# Patient Record
Sex: Male | Born: 1990 | Race: White | Hispanic: No | Marital: Single
Health system: Southern US, Community
[De-identification: ages and names within clinical notes are randomized; demographics above are authoritative.]

## PROBLEM LIST (undated history)

## (undated) DIAGNOSIS — R569 Unspecified convulsions: Secondary | ICD-10-CM

## (undated) DIAGNOSIS — F319 Bipolar disorder, unspecified: Secondary | ICD-10-CM

---

## 2018-10-25 ENCOUNTER — Other Ambulatory Visit: Payer: Self-pay

## 2018-10-25 ENCOUNTER — Encounter (HOSPITAL_COMMUNITY): Payer: Self-pay | Admitting: Emergency Medicine

## 2018-10-25 ENCOUNTER — Emergency Department (HOSPITAL_COMMUNITY)
Admission: EM | Admit: 2018-10-25 | Discharge: 2018-10-25 | Payer: Self-pay | Attending: Emergency Medicine | Admitting: Emergency Medicine

## 2018-10-25 DIAGNOSIS — Z5320 Procedure and treatment not carried out because of patient's decision for unspecified reasons: Secondary | ICD-10-CM | POA: Insufficient documentation

## 2018-10-25 DIAGNOSIS — T6591XA Toxic effect of unspecified substance, accidental (unintentional), initial encounter: Secondary | ICD-10-CM | POA: Insufficient documentation

## 2018-10-25 MED ORDER — SODIUM CHLORIDE 0.9 % IV BOLUS: 1000.0000 mL | Freq: Once | INTRAVENOUS | Status: DC

## 2018-10-25 MED ORDER — ONDANSETRON HCL 4 MG/2ML IJ SOLN: 4.0000 mg | Freq: Once | INTRAMUSCULAR | Status: DC

## 2018-10-25 NOTE — Discharge Instructions (Signed)
Return to the ED at any time you would like to be evaluated.  Please return for signs that you may have overdosed on opiates.  Please have someone keep an eye on you if you are able to get out of prison.

## 2018-10-25 NOTE — ED Notes (Signed)
Patient ambulatory to restroom with EMT.

## 2018-10-25 NOTE — ED Notes (Addendum)
Patient tearful c/o abdominal pain. States "I ate a bag of heroin." States ingested approximately 4.5g when [employer] arrived to house. MD made aware.

## 2018-10-25 NOTE — ED Provider Notes (Signed)
[facility] DEPT Provider Note   CSN: [id number] Arrival date & time: 10/25/18  1659    History   Chief Complaint Chief Complaint  Patient presents with  . Ingestion  . Alcohol Intoxication    HPI Paul Collins is a 28 y.o. male.     28 yo M with a chief complaint of heroin ingestion.  The patient was apparently apprehended by police.  He then was witnessed to ingest some heroin.  Told someone that he ingested somewhere over 4 g.  Has been having some abdominal cramping and retching in route and so was brought to the ED for evaluation.  Denies any other illegal substance.  Denies alcohol.  Patient gives very limited history.  The history is provided by the patient.  Ingestion This is a new problem. The current episode started less than 1 hour ago. The problem occurs constantly. The problem has not changed since onset.Associated symptoms include abdominal pain. Pertinent negatives include no chest pain, no headaches and no shortness of breath. Nothing aggravates the symptoms. Nothing relieves the symptoms. He has tried nothing for the symptoms. The treatment provided no relief.  Alcohol Intoxication Associated symptoms include abdominal pain. Pertinent negatives include no chest pain, no headaches and no shortness of breath.    History reviewed. No pertinent past medical history.  There are no active problems to display for this patient.   History reviewed. No pertinent surgical history.      Home Medications    Prior to Admission medications   Not on File    Family History No family history on file.  Social History Social History   Tobacco Use  . Smoking status: Not on file  Substance Use Topics  . Alcohol use: Not on file  . Drug use: Not on file     Allergies   Patient has no known allergies.   Review of Systems Review of Systems  Constitutional: Negative for chills and fever.  HENT: Negative for congestion and facial  swelling.   Eyes: Negative for discharge and visual disturbance.  Respiratory: Negative for shortness of breath.   Cardiovascular: Negative for chest pain and palpitations.  Gastrointestinal: Positive for abdominal pain, constipation, nausea and vomiting. Negative for diarrhea.  Musculoskeletal: Negative for arthralgias and myalgias.  Skin: Negative for color change and rash.  Neurological: Negative for tremors, syncope and headaches.  Psychiatric/Behavioral: Negative for confusion and dysphoric mood.     Physical Exam Updated Vital Signs BP (!) 174/97   Pulse (!) 125   Temp (!) 97.3 F (36.3 C) (Oral)   Resp (!) 22   SpO2 96%   Physical Exam Vitals signs and nursing note reviewed.  Constitutional:      Appearance: He is well-developed.  HENT:     Head: Normocephalic and atraumatic.  Eyes:     Pupils: Pupils are equal, round, and reactive to light.  Neck:     Musculoskeletal: Normal range of motion and neck supple.     Vascular: No JVD.  Cardiovascular:     Rate and Rhythm: Regular rhythm. Tachycardia present.     Heart sounds: No murmur. No friction rub. No gallop.   Pulmonary:     Effort: No respiratory distress.     Breath sounds: No wheezing.  Abdominal:     General: There is no distension.     Tenderness: There is no abdominal tenderness. There is no guarding or rebound.  Musculoskeletal: Normal range of motion.  Skin:  Coloration: Skin is not pale.     Findings: No rash.  Neurological:     Mental Status: He is alert and oriented to person, place, and time.  Psychiatric:        Behavior: Behavior normal.      ED Treatments / Results  Labs (all labs ordered are listed, but only abnormal results are displayed) Labs Reviewed  CBC WITH DIFFERENTIAL/PLATELET  COMPREHENSIVE METABOLIC PANEL  ETHANOL  RAPID URINE DRUG SCREEN, HOSP PERFORMED  URINALYSIS, ROUTINE W REFLEX MICROSCOPIC  ACETAMINOPHEN LEVEL  SALICYLATE LEVEL    EKG None  Radiology No  results found.  Procedures Procedures (including critical care time)  Medications Ordered in ED Medications  ondansetron (ZOFRAN) injection 4 mg (has no administration in time range)  sodium chloride 0.9 % bolus 1,000 mL (has no administration in time range)     Initial Impression / Assessment and Plan / ED Course  I have reviewed the triage vital signs and the nursing notes.  Pertinent labs & imaging results that were available during my care of the patient were reviewed by me and considered in my medical decision making (see chart for details).        28 yo M with a chief complaints of possible illegal drug ingestion.  The patient apparently tried to swallow a whole bunch of heroin when the police showed up.  He estimates that it was little bit over 4 g.  Patient is somewhat sleepy on my initial exam though is complaining of nausea and abdominal cramping which seems somewhat unlikely of opiate ingestion.  His heart rates in the 130s.  We will give a bolus of IV fluids.  We will obtain a plain film of the chest and the abdomen to evaluate for ingested substances.  Observe in the emergency department.  The patient is now trying to leave the emergency department.  He now appears to be awake and alert and without any discomfort.  Talking complete sentences without difficulty.  Appears of sound mind.  I discussed with him at length that if he did ingest that much heroin that something that could kill him.  I told him that he is going to prison for his outstanding warrants and that he may not be watched as closely as if he were to stay here and have us evaluate him.  Patient stated understanding and states that he would just like to go to prison setting up on pulled out that he get out of jail.  Offered for him to return at any time.  5:49 PM:  I have discussed the diagnosis/risks/treatment options with the patient and believe the pt to be eligible for discharge home to follow-up with PCP. We  also discussed returning to the ED immediately if new or worsening sx occur. We discussed the sx which are most concerning (e.g., any concern for opiate overdose) that necessitate immediate return. Medications administered to the patient during their visit and any new prescriptions provided to the patient are listed below.  Medications given during this visit Medications  ondansetron (ZOFRAN) injection 4 mg (has no administration in time range)  sodium chloride 0.9 % bolus 1,000 mL (has no administration in time range)     The patient appears reasonably screen and/or stabilized for discharge and I doubt any other medical condition or other [facility] requiring further screening, evaluation, or treatment in the ED at this time prior to discharge.    Final Clinical Impressions(s) / ED Diagnoses   Final diagnoses:  Accidental ingestion of substance, initial encounter    ED Discharge Orders    None       Melene PlanFloyd, Haivyn Oravec, DO 10/25/18 1749

## 2018-10-25 NOTE — ED Notes (Signed)
Unable to get ekg patient is in the restroom

## 2018-10-25 NOTE — ED Notes (Addendum)
Patient stating he wants to leave "to get a bond." Patient refusing care. [employer] remains at bedside with patient. MD at bedside. Patient signed AMA. Made aware of risks of leaving. IV removed from left FA.

## 2018-10-25 NOTE — ED Triage Notes (Addendum)
Per EMS, patient was in custody on the way to jail when he started experiencing dry heaving. Patient spitting with EMS. Reports ETOH.  16g L FA  Patient not answering questions during triage.  [employer] reports patient stated he ingested approximately 0.5g of heroin.

## 2018-10-25 NOTE — ED Notes (Signed)
Patient removing cardiac monitoring. MD made aware.

## 2018-10-25 NOTE — ED Notes (Signed)
Per Patty at [employer],  Recommendations include:  Charcoal, monitor x6 hours for CNS and respiratory depression, Narcan if symptomatic

## 2018-10-26 ENCOUNTER — Emergency Department (HOSPITAL_COMMUNITY)

## 2018-10-26 ENCOUNTER — Encounter (HOSPITAL_COMMUNITY): Payer: Self-pay | Admitting: Emergency Medicine

## 2018-10-26 ENCOUNTER — Emergency Department (HOSPITAL_COMMUNITY)
Admission: EM | Admit: 2018-10-26 | Discharge: 2018-10-26 | Attending: Emergency Medicine | Admitting: Emergency Medicine

## 2018-10-26 DIAGNOSIS — S098XXA Other specified injuries of head, initial encounter: Secondary | ICD-10-CM | POA: Diagnosis not present

## 2018-10-26 DIAGNOSIS — S0990XA Unspecified injury of head, initial encounter: Secondary | ICD-10-CM

## 2018-10-26 DIAGNOSIS — Y92148 Other place in prison as the place of occurrence of the external cause: Secondary | ICD-10-CM | POA: Diagnosis not present

## 2018-10-26 DIAGNOSIS — W01198A Fall on same level from slipping, tripping and stumbling with subsequent striking against other object, initial encounter: Secondary | ICD-10-CM | POA: Insufficient documentation

## 2018-10-26 DIAGNOSIS — M542 Cervicalgia: Secondary | ICD-10-CM | POA: Insufficient documentation

## 2018-10-26 DIAGNOSIS — Y9302 Activity, running: Secondary | ICD-10-CM | POA: Diagnosis not present

## 2018-10-26 DIAGNOSIS — Y999 Unspecified external cause status: Secondary | ICD-10-CM | POA: Insufficient documentation

## 2018-10-26 HISTORY — DX: Bipolar disorder, unspecified: F31.9

## 2018-10-26 HISTORY — DX: Unspecified convulsions: R56.9

## 2018-10-26 MED ORDER — CYCLOBENZAPRINE HCL 10 MG PO TABS
10.0000 mg | ORAL_TABLET | Freq: Once | ORAL | Status: AC
  Administered 2018-10-26: 10 mg via ORAL
  Filled 2018-10-26: qty 1

## 2018-10-26 MED ORDER — IBUPROFEN 400 MG PO TABS: 600.0000 mg | ORAL_TABLET | Freq: Once | ORAL | Status: DC

## 2018-10-26 MED ORDER — KETOROLAC TROMETHAMINE 30 MG/ML IJ SOLN
30.0000 mg | Freq: Once | INTRAMUSCULAR | Status: AC
  Administered 2018-10-26: 30 mg via INTRAMUSCULAR
  Filled 2018-10-26: qty 1

## 2018-10-26 MED ORDER — FENTANYL CITRATE (PF) 100 MCG/2ML IJ SOLN
50.0000 ug | Freq: Once | INTRAMUSCULAR | Status: AC
  Administered 2018-10-26: 50 ug via INTRAMUSCULAR
  Filled 2018-10-26: qty 2

## 2018-10-26 NOTE — ED Triage Notes (Addendum)
Pt in police custody. Pt hit his head on a door at the jail after he took off running in jail. Pt has neck and upper back pain. Ambulatory.

## 2018-10-26 NOTE — Discharge Instructions (Signed)
You can take 600 mg of ibuprofen every 6 hours as needed for pain.  Take this with food to avoid upset stomach issues.  You can take 1 to 2 tablets of Tylenol every 6 hours additionally if your pain persist.  You can alternate these medications every 3 hours or so.  You may have a concussion, which is head injury without evidence of abnormality on CT scan.  Stay in a dimly lit room with minimal stimuli, reduce screen time (phone, TV) by at least 50% if not more, and avoid contact sports until cleared by a physician.    I anticipate you will probably feel sore for approximately 1 week.  Do not feel surprised if you wake up tomorrow feeling more sore than you do today. Follow-up with a primary care physician if you are not back to your normal activity levels within 1 week.  Return to the emergency department immediately for any concerning signs or symptoms develop such as altered mental status, persistent vomiting, weakness, severe swelling or redness of a limb, or fevers.

## 2018-10-26 NOTE — ED Provider Notes (Signed)
[facility]   CSN: [id number] Arrival date & time: 10/26/18  1244    History   Chief Complaint Chief Complaint  Patient presents with   Head Injury    HPI Dat Paul Collins is a 28 y.o. male with history of bipolar 1 disorder and seizures presents for evaluation of acute onset, persistent frontal headache and neck pain secondary to head injury 1 hour prior to arrival.  He is accompanied by police, currently in police custody.  Triage note states that the patient took off running while in the jail and the patient states that he tripped, landing forward and striking his forehead on a door.  He reports loss of consciousness for a few seconds.  Endorses persistent throbbing headache to the temples that does not radiate.  Initially reported some blurred vision but states this has resolved.  Denies nausea, vomiting, numbness, tingling, or weakness.  He is reporting sharp pains to the neck and upper back.  Denies chest pain, shortness of breath, abdominal pain.     The history is provided by the patient.    Past Medical History:  Diagnosis Date   Bipolar 1 disorder (HCC)    Seizures (HCC)     There are no active problems to display for this patient.   No past surgical history on file.      Home Medications    Prior to Admission medications   Not on File    Family History No family history on file.  Social History Social History   Tobacco Use   Smoking status: Not on file  Substance Use Topics   Alcohol use: Not on file   Drug use: Not on file     Allergies   Patient has no known allergies.   Review of Systems Review of Systems  Constitutional: Negative for fever.  Eyes: Positive for visual disturbance (resolved). Negative for photophobia.  Respiratory: Negative for shortness of breath.   Cardiovascular: Negative for chest pain.  Gastrointestinal: Negative for abdominal pain, nausea and vomiting.    Musculoskeletal: Positive for neck pain.  Neurological: Positive for syncope and headaches. Negative for weakness and numbness.  All other systems reviewed and are negative.    Physical Exam Updated Vital Signs BP (!) 150/98    Pulse 77    Temp (!) 97.3 F (36.3 C)    Resp 18    SpO2 95%   Physical Exam Vitals signs and nursing note reviewed.  Constitutional:      General: He is not in acute distress.    Appearance: He is well-developed.     Comments: Patient handcuffed to the bed, accompanied by law enforcement  HENT:     Head: Normocephalic and atraumatic.  Eyes:     General:        Right eye: No discharge.        Left eye: No discharge.     Conjunctiva/sclera: Conjunctivae normal.  Neck:     Musculoskeletal: Neck supple.     Vascular: No JVD.     Trachea: No tracheal deviation.     Comments: C-collar in place.  Patient with diffuse midline cervical spine tenderness with bilateral paracervical muscle tenderness.  No deformity, crepitus, or step-off. Cardiovascular:     Rate and Rhythm: Normal rate and regular rhythm.  Pulmonary:     Effort: Pulmonary effort is normal.     Breath sounds: Normal breath sounds.     Comments: No tenderness to palpation,  no crepitus, deformity, ecchymosis, or flail segment. Chest:     Chest wall: No tenderness.  Abdominal:     General: Bowel sounds are normal. There is no distension.     Palpations: Abdomen is soft.     Tenderness: There is no abdominal tenderness. There is no guarding or rebound.  Musculoskeletal:     Comments: 5/5 strength of BUE and BLE major muscle groups.  Normal range of motion of the joints.  No midline thoracic or lumbar spine tenderness or parathoracic or paralumbar muscle tenderness.  Skin:    General: Skin is warm and dry.     Findings: No erythema.  Neurological:     General: No focal deficit present.     Mental Status: He is alert and oriented to person, place, and time.     Cranial Nerves: No cranial  nerve deficit.     Sensory: No sensory deficit.     Motor: No weakness.     Comments: Mental Status:  Alert, thought content appropriate, able to give a coherent history. Speech fluent without evidence of aphasia. Able to follow 2 step commands without difficulty.  Cranial Nerves:  II:  Peripheral visual fields grossly normal, pupils equal, round, reactive to light III,IV, VI: ptosis not present, extra-ocular motions intact bilaterally  V,VII: smile symmetric, facial light touch sensation equal VIII: hearing grossly normal to voice  X: uvula elevates symmetrically  XI: bilateral shoulder shrug symmetric and strong XII: midline tongue extension without fassiculations Motor:  Normal tone. 5/5 strength of BUE and BLE major muscle groups including strong and equal grip strength and dorsiflexion/plantar flexion Sensory: light touch normal in all extremities. Gait: Ambulatory with steady gait and balance.  Psychiatric:        Behavior: Behavior normal.      ED Treatments / Results  Labs (all labs ordered are listed, but only abnormal results are displayed) Labs Reviewed - No data to display  EKG None  Radiology Ct Head Wo Contrast  Result Date: 10/26/2018 CLINICAL DATA:  Posttraumatic headaches. EXAM: CT HEAD WITHOUT CONTRAST CT CERVICAL SPINE WITHOUT CONTRAST TECHNIQUE: Multidetector CT imaging of the head and cervical spine was performed following the standard protocol without intravenous contrast. Multiplanar CT image reconstructions of the cervical spine were also generated. COMPARISON:  None. FINDINGS: CT HEAD FINDINGS Brain: No evidence of acute infarction, hemorrhage, hydrocephalus, extra-axial collection or mass lesion/mass effect. Vascular: No hyperdense vessel or unexpected calcification. Skull: Normal. Negative for fracture or focal lesion. Sinuses/Orbits: No acute finding. Other: None. CT CERVICAL SPINE FINDINGS Alignment: Normal. Skull base and vertebrae: No acute fracture.  No primary bone lesion or focal pathologic process. Soft tissues and spinal canal: No prevertebral fluid or swelling. No visible canal hematoma. Disc levels: The intervertebral spaces are normal. No significant degenerative joint changes are identified. Upper chest: Negative. Other: None IMPRESSION: No focal acute intracranial abnormality identified. No acute fracture or dislocation of cervical spine. Electronically Signed   By: Sherian ReinWei-Chen  Lin M.D.   On: 10/26/2018 14:02   Ct Cervical Spine Wo Contrast  Result Date: 10/26/2018 CLINICAL DATA:  Posttraumatic headaches. EXAM: CT HEAD WITHOUT CONTRAST CT CERVICAL SPINE WITHOUT CONTRAST TECHNIQUE: Multidetector CT imaging of the head and cervical spine was performed following the standard protocol without intravenous contrast. Multiplanar CT image reconstructions of the cervical spine were also generated. COMPARISON:  None. FINDINGS: CT HEAD FINDINGS Brain: No evidence of acute infarction, hemorrhage, hydrocephalus, extra-axial collection or mass lesion/mass effect. Vascular: No hyperdense vessel or unexpected  calcification. Skull: Normal. Negative for fracture or focal lesion. Sinuses/Orbits: No acute finding. Other: None. CT CERVICAL SPINE FINDINGS Alignment: Normal. Skull base and vertebrae: No acute fracture. No primary bone lesion or focal pathologic process. Soft tissues and spinal canal: No prevertebral fluid or swelling. No visible canal hematoma. Disc levels: The intervertebral spaces are normal. No significant degenerative joint changes are identified. Upper chest: Negative. Other: None IMPRESSION: No focal acute intracranial abnormality identified. No acute fracture or dislocation of cervical spine. Electronically Signed   By: Abelardo Diesel M.D.   On: 10/26/2018 14:02    Procedures Procedures (including critical care time)  Medications Ordered in ED Medications  cyclobenzaprine (FLEXERIL) tablet 10 mg (10 mg Oral Given 10/26/18 1336)  fentaNYL  (SUBLIMAZE) injection 50 mcg (50 mcg Intramuscular Given 10/26/18 1335)  ketorolac (TORADOL) 30 MG/ML injection 30 mg (30 mg Intramuscular Given 10/26/18 1426)     Initial Impression / Assessment and Plan / ED Course  I have reviewed the triage vital signs and the nursing notes.  Pertinent labs & imaging results that were available during my care of the patient were reviewed by me and considered in my medical decision making (see chart for details).        Patient presents for evaluation after mechanical fall.  No prodrome leading up to the fall.  He was reportedly running in the jail.  He has a headache and neck pain.  He is afebrile, vital signs are stable.  He is nontoxic in appearance.  He is neurovascularly intact with a normal neurologic examination.  Has been ambulatory since hitting his head and is ambulatory in the ED.  Imaging shows no evidence of acute intracranial abnormality, skull fracture, ICH, SAH, or cervical spine injury.  The remainder of his physical examination is atraumatic and he shows no signs of serious head injury.  Pain controlled in the ED.  Discussed concussion precautions.  Recommend follow-up with PCP if symptoms persist.  Discussed strict ED return precautions.  Patient verbalized understanding of and agreement with plan and patient stable for discharge at this time.  Final Clinical Impressions(s) / ED Diagnoses   Final diagnoses:  Injury of head, initial encounter  Neck pain, acute    ED Discharge Orders    None       Renita Papa, PA-C 10/26/18 [place], [place], DO 10/26/18 1556

## 2018-10-26 NOTE — ED Notes (Signed)
To ct with deputies

## 2018-10-27 ENCOUNTER — Emergency Department (HOSPITAL_COMMUNITY): Payer: Self-pay

## 2018-10-27 ENCOUNTER — Encounter (HOSPITAL_COMMUNITY): Payer: Self-pay

## 2018-10-27 ENCOUNTER — Emergency Department (HOSPITAL_COMMUNITY)
Admission: EM | Admit: 2018-10-27 | Discharge: 2018-10-27 | Payer: Self-pay | Attending: Emergency Medicine | Admitting: Emergency Medicine

## 2018-10-27 ENCOUNTER — Other Ambulatory Visit: Payer: Self-pay

## 2018-10-27 DIAGNOSIS — Y939 Activity, unspecified: Secondary | ICD-10-CM | POA: Insufficient documentation

## 2018-10-27 DIAGNOSIS — Y92143 Cell of prison as the place of occurrence of the external cause: Secondary | ICD-10-CM | POA: Insufficient documentation

## 2018-10-27 DIAGNOSIS — S0990XA Unspecified injury of head, initial encounter: Secondary | ICD-10-CM

## 2018-10-27 DIAGNOSIS — F319 Bipolar disorder, unspecified: Secondary | ICD-10-CM | POA: Insufficient documentation

## 2018-10-27 DIAGNOSIS — S0001XA Abrasion of scalp, initial encounter: Secondary | ICD-10-CM | POA: Insufficient documentation

## 2018-10-27 DIAGNOSIS — R440 Auditory hallucinations: Secondary | ICD-10-CM | POA: Insufficient documentation

## 2018-10-27 DIAGNOSIS — X838XXA Intentional self-harm by other specified means, initial encounter: Secondary | ICD-10-CM | POA: Insufficient documentation

## 2018-10-27 DIAGNOSIS — Z7689 Persons encountering health services in other specified circumstances: Secondary | ICD-10-CM

## 2018-10-27 DIAGNOSIS — Y999 Unspecified external cause status: Secondary | ICD-10-CM | POA: Insufficient documentation

## 2018-10-27 DIAGNOSIS — Z23 Encounter for immunization: Secondary | ICD-10-CM | POA: Insufficient documentation

## 2018-10-27 DIAGNOSIS — S060X1A Concussion with loss of consciousness of 30 minutes or less, initial encounter: Secondary | ICD-10-CM | POA: Insufficient documentation

## 2018-10-27 MED ORDER — ACETAMINOPHEN 500 MG PO TABS
1000.0000 mg | ORAL_TABLET | Freq: Once | ORAL | Status: AC
  Administered 2018-10-27: 14:00:00 1000 mg via ORAL
  Filled 2018-10-27: qty 2

## 2018-10-27 MED ORDER — TETANUS-DIPHTH-ACELL PERTUSSIS 5-2.5-18.5 LF-MCG/0.5 IM SUSP
0.5000 mL | Freq: Once | INTRAMUSCULAR | Status: AC
  Administered 2018-10-27: 16:00:00 0.5 mL via INTRAMUSCULAR
  Filled 2018-10-27: qty 0.5

## 2018-10-27 MED ORDER — HYDROXYZINE HCL 25 MG PO TABS
25.0000 mg | ORAL_TABLET | Freq: Once | ORAL | Status: AC
  Administered 2018-10-27: 14:00:00 25 mg via ORAL
  Filled 2018-10-27: qty 1

## 2018-10-27 MED ORDER — ACETAMINOPHEN 325 MG PO TABS: 650.0000 mg | ORAL_TABLET | Freq: Four times a day (QID) | ORAL | 0 refills | Status: AC | PRN

## 2018-10-27 NOTE — ED Triage Notes (Addendum)
Patient from jail with guards. Patient reportedly hit head on wall. Staff report positive loc. Patient arrived with abrasion to forehead and c-collar. Patient did same yesterday. Patient alert and oriented. [employer] 15. Patient has been in jail only 2 days and been off [facility] meds x 2 weeks

## 2018-10-27 NOTE — ED Notes (Signed)
Deputies at the bedside.

## 2018-10-27 NOTE — ED Notes (Signed)
TTS in progress 

## 2018-10-27 NOTE — [facility] Counselor (Addendum)
Cart located and is not working, IT is present working on the cart.  Unable to start teleassessment, cart cannot be located.

## 2018-10-27 NOTE — BH Assessment (Signed)
Assessment Note  Paul Collins is an 28 y.o. male who was brought to [facility] by [employer] from detention.  The Patient bang his head on the wall after being told by voices to do it.  This is the third time since the Patient was arrested on 10-25-2018 that he had to be transported to ED from from jail. Patient presented orientated x4, mood "manic and panic", affect anxious.  Patient reports current SI with no intent or plan.  He denied HI and VH.  Patient reports AH of voices telling him to bang his head and try to get Deputies' guns.  Patient reports leaving AMA from [facility] inpatient psychiatric unit in [place] 3 weeks ago after finding out his Mother was dying of cancer in [place].  He reports attempting SI 7x in the past 6 months.  Patient reports being prescribed Depakote and Thorazine for Bipolar Disorder and Schizophrenia.  He reports not being medication adherent since leaving psychiatric hospitalization.  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  Patient reports being arrested for domestic violence and probation violation.  He reports wanting readmission to psychiatric hospital.  Per Ricky Ala, NP;  Patient does not meet inpatient criteria and will need to return to detention after medical clearance.  Patient's disposition provided to Langston Masker, PA-C            Diagnosis:  Bipolar 1 Disorder  Past Medical History:  Past Medical History:  Diagnosis Date  . Bipolar 1 disorder ([place])   . Seizures ([place])     History reviewed. No pertinent surgical history.  Family History: No family history on file.  Social History:  has no history on file for tobacco, alcohol, and drug.  Additional Social History:     CIWA: CIWA-Ar BP: 135/90 Pulse Rate: 76 COWS:    Allergies: No Known Allergies  Home Medications: (Not in a hospital admission)   OB/GYN Status:  No LMP for male patient.  General Assessment Data Location of Assessment: [facility] ED Is this a Tele or  Face-to-Face Assessment?: Tele Assessment Is this an Initial Assessment or a Re-assessment for this encounter?: Initial Assessment Patient Accompanied by:: Other(Law Enforcement) Language Other than English: No Living Arrangements: Jail/Detention Marital status: Single Living Arrangements: Other (Comment)(Jail) Can pt return to current living arrangement?: Yes Is patient capable of signing voluntary admission?: No Insurance type: Generic Inmates  Medical Screening Exam ([address]) Medical Exam completed: Yes  Crisis Care Plan Living Arrangements: Other (Comment)(Jail) Name of Psychiatrist: None Name of Therapist: None  Education Status Is patient currently in school?: No  Risk to self with the past 6 months Suicidal Ideation: Yes-Currently Present Has patient been a risk to self within the past 6 months prior to admission? : No Suicidal Intent: No Has patient had any suicidal intent within the past 6 months prior to admission? : No Is patient at risk for suicide?: No Suicidal Plan?: No Has patient had any suicidal plan within the past 6 months prior to admission? : No Access to Means: No What has been your use of drugs/alcohol within the last 12 months?: Heroin Previous Attempts/Gestures: Yes How many times?: 7(Patient reports in a 6 month period) Triggers for Past Attempts: Unknown Intentional Self Injurious Behavior: None Family Suicide History: Unknown Recent stressful life event(s): (incarcerated) Persecutory voices/beliefs?: Yes Depression: No Substance abuse history and/or treatment for substance abuse?: Yes(Heroin) Suicide prevention information given to non-admitted patients: Not applicable  Risk to Others within the  past 6 months Homicidal Ideation: No Does patient have any lifetime risk of violence toward others beyond the six months prior to admission? : No Thoughts of Harm to Others: No Current Homicidal Intent: No Current Homicidal Plan: No Access  to Homicidal Means: No History of harm to others?: No Assessment of Violence: None Noted Does patient have access to weapons?: No Criminal Charges Pending?: Yes Describe Pending Criminal Charges: Domestic violence aND PROBATION VIOLATION Does patient have a court date: Yes Court Date: ([place] awaiting a court date) Is patient on probation?: No  Psychosis Hallucinations: Auditory Delusions: None noted  Mental Status Report Appearance/Hygiene: In scrubs Eye Contact: Good Motor Activity: Unremarkable Speech: Logical/coherent Level of Consciousness: Alert Mood: Anxious Affect: Anxious Anxiety Level: Moderate Thought Processes: Coherent, Relevant Judgement: Partial Orientation: Person, Place, Time, Situation Obsessive Compulsive Thoughts/Behaviors: None  Cognitive Functioning Concentration: Good Memory: Recent Intact, Remote Intact Is patient IDD: No Insight: Poor Impulse Control: Poor Appetite: Good Have you had any weight changes? : No Change Sleep: No Change Vegetative Symptoms: None  ADLScreening [facility] Assessment Services) Patient's cognitive ability adequate to safely complete daily activities?: Yes Patient able to express need for assistance with ADLs?: Yes Independently performs ADLs?: Yes (appropriate for developmental age)  Prior Inpatient Therapy Prior Inpatient Therapy: Yes Prior Therapy Dates: 2020 Prior Therapy Facilty/Provider(s): [place]) Reason for Treatment: SI attempt  Prior Outpatient Therapy Prior Outpatient Therapy: Yes Prior Therapy Dates: Unknown Prior Therapy Facilty/Provider(s): Unknown Reason for Treatment: [place] Does patient have an ACCT team?: No Does patient have Intensive In-House Services?  : No Does patient have [facility] services? : No Does patient have P4CC services?: No  ADL Screening (condition at time of admission) Patient's cognitive ability adequate to safely complete daily activities?: Yes Is the patient deaf  or have difficulty hearing?: No Does the patient have difficulty seeing, even when wearing glasses/contacts?: No Does the patient have difficulty concentrating, remembering, or making decisions?: No Patient able to express need for assistance with ADLs?: Yes Does the patient have difficulty dressing or bathing?: No Independently performs ADLs?: Yes (appropriate for developmental age) Does the patient have difficulty walking or climbing stairs?: No Weakness of Legs: None Weakness of Arms/Hands: None  Home Assistive Devices/Equipment Home Assistive Devices/Equipment: None      Values / Beliefs Cultural Requests During Hospitalization: None Spiritual Requests During Hospitalization: None   Advance Directives (For Healthcare) Does Patient Have a Medical Advance Directive?: No Would patient like information on creating a medical advance directive?: No - Patient declined          Disposition:  Disposition Initial Assessment Completed for this Encounter: Yes  On Site Evaluation by:   Reviewed with Physician:    Dey-Johnson,Sayre Witherington 10/27/2018 4:18 PM

## 2018-10-27 NOTE — Discharge Instructions (Signed)
For headaches, would recommend Tylenol, 650 mg every 6 hours as needed for pain.   Consider jail psychiatrist to restart patient's reported antipsychotics if they can be verified from [place].   Would recommend continued assessment in jail for suicidality and self harm attempts.   Please return if there are any visual changes, weakness or numbness in extremities, confusion, or any new or worsening symptoms.  Thank you for allowing [place] to participate in your care today.

## 2018-10-27 NOTE — ED Provider Notes (Signed)
[facility] EM[facility]GENC[facility] Provider Note   CSN: [id number] Arrival date & time: 10/27/18  1147     History   Chief Complaint No chief complaint on file.   HPI Paul Collins is a 28 y.o. male.     HPI  Patient is a [age] male with past medical history of bipolar disorder, seizures presenting for head injury after intentionally hitting his head on a wall today. Patient reports that this was a self harm attempt. He reports "voices" caused him to do this.  He reports loss of consciousness and officers with him verify that there was a witnessed LOC.  No seizure-like activity.  Unclear how long the LOC was.  Patient denies vomiting.  He reports retrograde amnesia.  He denies visual disturbance, numbness of extremities, but feels that there is "shooting pain" rating on his right side that is making him feel weak.  He reports pain down his entire spine.  Patient reports that he came here 2 weeks ago [place] medication from [place].  He did not have a specific plan when he started driving here.  He does not know what pharmacy he had [place] prescriptions filled out in [place] to verify what medications he is supposed to be on.  Past Medical History:  Diagnosis Date   Bipolar 1 disorder ([facility])    Seizures ([facility])     There are no active problems to display for this patient.   History reviewed. No pertinent surgical history.      Home Medications    Prior to Admission medications   Not on File    Family History No family history on file.  Social History Social History   Tobacco Use   Smoking status: Not on file  Substance Use Topics   Alcohol use: Not on file   Drug use: Not on file     Allergies   Patient has no known allergies.   Review of Systems Review of Systems  Eyes: Negative for visual disturbance.  Gastrointestinal: Negative for vomiting.  Skin: Positive for wound.  Neurological: Positive for syncope and headaches. Negative for  dizziness, weakness, light-headedness and numbness.  All other systems reviewed and are negative.    Physical Exam Updated Vital Signs BP 135/90 (BP Location: Right Arm)    Pulse 76    Temp 98.5 F (36.9 C) (Oral)    Resp 18    SpO2 99%   Physical Exam Vitals signs and nursing note reviewed.  Constitutional:      General: He is not in acute distress.    Appearance: He is well-developed.  HENT:     Head: Normocephalic.     Comments: Small superficial abrasion over crown.     Nose: Nose normal.     Mouth/Throat:     Mouth: Mucous membranes are moist.  Eyes:     Conjunctiva/sclera: Conjunctivae normal.     Pupils: Pupils are equal, round, and reactive to light.  Neck:     Musculoskeletal: Normal range of motion and neck supple.  Cardiovascular:     Rate and Rhythm: Normal rate and regular rhythm.     Heart sounds: S1 normal and S2 normal. No murmur.  Pulmonary:     Effort: Pulmonary effort is normal.     Breath sounds: Normal breath sounds. No wheezing or rales.  Abdominal:     General: There is no distension.     Palpations: Abdomen is soft.     Tenderness: There is no  abdominal tenderness. There is no guarding.  Musculoskeletal: Normal range of motion.        General: No deformity.     Comments: Patient is reporting midline tenderness through the cervical collar of his cervical spine.  Paraspinal muscular tenderness of thoracic and lumbar spine throughout.  No midline tenderness of thoracic and lumbar spine.   Lymphadenopathy:     Cervical: No cervical adenopathy.  Skin:    General: Skin is warm and dry.     Findings: No erythema or rash.     Comments: Several tattoos present on skin. No lesions of skin.   Neurological:     Mental Status: He is alert.     Comments: Mental Status:  Alert, oriented, thought content appropriate, able to give a coherent history. Speech fluent without evidence of aphasia. Able to follow 2 step commands without difficulty.  Cranial Nerves:    II:  Peripheral visual fields grossly normal, pupils equal, round, reactive to light III,IV, VI: ptosis not present, extra-ocular motions intact bilaterally  V,VII: smile symmetric, facial light touch sensation equal VIII: hearing grossly normal to voice  X: uvula elevates symmetrically  XI: bilateral shoulder shrug symmetric and strong XII: midline tongue extension without fassiculations Motor:  Normal tone. 5/5 in upper and lower extremities bilaterally including strong and equal grip strength and dorsiflexion/plantar flexion Sensory: Pinprick and light touch normal in all extremities.  Deep Tendon Reflexes: 2+ and symmetric in the biceps and patella.  Cerebellar: normal finger-to-nose with bilateral upper extremities Gait: normal gait and balance Stance: No pronator drift and good coordination, strength, and position sense with tapping of bilateral arms (performed in sitting position). CV: distal pulses palpable throughout    Psychiatric:        Behavior: Behavior normal.        Thought Content: Thought content normal.        Judgment: Judgment normal.      ED Treatments / Results  Labs (all labs ordered are listed, but only abnormal results are displayed) Labs Reviewed - No data to display  EKG None  Radiology Dg Thoracic Spine 2 View  Result Date: 10/27/2018 CLINICAL DATA:  Center thoracic spine pain at the level of T7 after headbutting a wall this AM. Pt denies lumbar pain at time of imaging. He states he knocked himself out and fell and does not remember anything after. EXAM: THORACIC SPINE 2 VIEWS COMPARISON:  None. FINDINGS: No fracture or bone lesion.  No spondylolisthesis. Disc spaces are well preserved.  No degenerative changes. Soft tissues are unremarkable. IMPRESSION: Negative. Electronically Signed   By: Amie Portlandavid  Ormond M.D.   On: 10/27/2018 13:29   Dg Lumbar Spine Complete  Result Date: 10/27/2018 CLINICAL DATA:  Center thoracic spine pain at the level of T7  after headbutting a wall this AM. Pt denies lumbar pain at time of imaging. He states he knocked himself out and fell and does not remember anything after. EXAM: LUMBAR SPINE - COMPLETE 4+ VIEW COMPARISON:  None. FINDINGS: There is no evidence of lumbar spine fracture. Alignment is normal. Intervertebral disc spaces are maintained. IMPRESSION: Negative. Electronically Signed   By: Amie Portlandavid  Ormond M.D.   On: 10/27/2018 13:30   Ct Head Wo Contrast  Result Date: 10/27/2018 CLINICAL DATA:  Ct head/cspine wo, Patient from jail with guards. Patient reportedly hit head on wall. Staff report positive loc. Patient arrived with abrasion to forehead and c-collar. Patient did same yesterday. Patient alert and oriented. GCS 15. Patient has  been in jail only 2 days and been off [place] meds x 2 weeks EXAM: CT HEAD WITHOUT CONTRAST CT CERVICAL SPINE WITHOUT CONTRAST TECHNIQUE: Multidetector CT imaging of the head and cervical spine was performed following the standard protocol without intravenous contrast. Multiplanar CT image reconstructions of the cervical spine were also generated. COMPARISON:  10/26/2018 FINDINGS: CT HEAD FINDINGS Brain: No evidence of acute infarction, hemorrhage, hydrocephalus, extra-axial collection or mass lesion/mass effect. Vascular: No hyperdense vessel or unexpected calcification. Skull: Normal. Negative for fracture or focal lesion. Sinuses/Orbits: Globes and orbits are unremarkable. Visualized sinuses and mastoid air cells are clear. Other: Small contusion to the superior frontal scalp. CT CERVICAL SPINE FINDINGS Alignment: Normal. Skull base and vertebrae: No acute fracture. No primary bone lesion or focal pathologic process. Soft tissues and spinal canal: No prevertebral fluid or swelling. No visible canal hematoma. Disc levels: Discs are well maintained in height. No degenerative change. No disc bulging or evidence of a disc herniation. Central spinal canal and neural foramina are well preserved.  Upper chest: Negative. Other: None. IMPRESSION: HEAD CT 1. No intracranial abnormality. 2. No skull fracture. CERVICAL CT 1. Normal. Electronically Signed   By: Lajean Manes M.D.   On: 10/27/2018 13:06   Ct Head Wo Contrast  Result Date: 10/26/2018 CLINICAL DATA:  Posttraumatic headaches. EXAM: CT HEAD WITHOUT CONTRAST CT CERVICAL SPINE WITHOUT CONTRAST TECHNIQUE: Multidetector CT imaging of the head and cervical spine was performed following the standard protocol without intravenous contrast. Multiplanar CT image reconstructions of the cervical spine were also generated. COMPARISON:  None. FINDINGS: CT HEAD FINDINGS Brain: No evidence of acute infarction, hemorrhage, hydrocephalus, extra-axial collection or mass lesion/mass effect. Vascular: No hyperdense vessel or unexpected calcification. Skull: Normal. Negative for fracture or focal lesion. Sinuses/Orbits: No acute finding. Other: None. CT CERVICAL SPINE FINDINGS Alignment: Normal. Skull base and vertebrae: No acute fracture. No primary bone lesion or focal pathologic process. Soft tissues and spinal canal: No prevertebral fluid or swelling. No visible canal hematoma. Disc levels: The intervertebral spaces are normal. No significant degenerative joint changes are identified. Upper chest: Negative. Other: None IMPRESSION: No focal acute intracranial abnormality identified. No acute fracture or dislocation of cervical spine. Electronically Signed   By: Abelardo Diesel M.D.   On: 10/26/2018 14:02   Ct Cervical Spine Wo Contrast  Result Date: 10/27/2018 CLINICAL DATA:  Ct head/cspine wo, Patient from jail with guards. Patient reportedly hit head on wall. Staff report positive loc. Patient arrived with abrasion to forehead and c-collar. Patient did same yesterday. Patient alert and oriented. GCS 15. Patient has been in jail only 2 days and been off [place] meds x 2 weeks EXAM: CT HEAD WITHOUT CONTRAST CT CERVICAL SPINE WITHOUT CONTRAST TECHNIQUE: Multidetector CT  imaging of the head and cervical spine was performed following the standard protocol without intravenous contrast. Multiplanar CT image reconstructions of the cervical spine were also generated. COMPARISON:  10/26/2018 FINDINGS: CT HEAD FINDINGS Brain: No evidence of acute infarction, hemorrhage, hydrocephalus, extra-axial collection or mass lesion/mass effect. Vascular: No hyperdense vessel or unexpected calcification. Skull: Normal. Negative for fracture or focal lesion. Sinuses/Orbits: Globes and orbits are unremarkable. Visualized sinuses and mastoid air cells are clear. Other: Small contusion to the superior frontal scalp. CT CERVICAL SPINE FINDINGS Alignment: Normal. Skull base and vertebrae: No acute fracture. No primary bone lesion or focal pathologic process. Soft tissues and spinal canal: No prevertebral fluid or swelling. No visible canal hematoma. Disc levels: Discs are well maintained in height. No  degenerative change. No disc bulging or evidence of a disc herniation. Central spinal canal and neural foramina are well preserved. Upper chest: Negative. Other: None. IMPRESSION: HEAD CT 1. No intracranial abnormality. 2. No skull fracture. CERVICAL CT 1. Normal. Electronically Signed   By: Amie Portlandavid  Ormond M.D.   On: 10/27/2018 13:06   Ct Cervical Spine Wo Contrast  Result Date: 10/26/2018 CLINICAL DATA:  Posttraumatic headaches. EXAM: CT HEAD WITHOUT CONTRAST CT CERVICAL SPINE WITHOUT CONTRAST TECHNIQUE: Multidetector CT imaging of the head and cervical spine was performed following the standard protocol without intravenous contrast. Multiplanar CT image reconstructions of the cervical spine were also generated. COMPARISON:  None. FINDINGS: CT HEAD FINDINGS Brain: No evidence of acute infarction, hemorrhage, hydrocephalus, extra-axial collection or mass lesion/mass effect. Vascular: No hyperdense vessel or unexpected calcification. Skull: Normal. Negative for fracture or focal lesion. Sinuses/Orbits: No  acute finding. Other: None. CT CERVICAL SPINE FINDINGS Alignment: Normal. Skull base and vertebrae: No acute fracture. No primary bone lesion or focal pathologic process. Soft tissues and spinal canal: No prevertebral fluid or swelling. No visible canal hematoma. Disc levels: The intervertebral spaces are normal. No significant degenerative joint changes are identified. Upper chest: Negative. Other: None IMPRESSION: No focal acute intracranial abnormality identified. No acute fracture or dislocation of cervical spine. Electronically Signed   By: Sherian ReinWei-Chen  Lin M.D.   On: 10/26/2018 14:02    Procedures Procedures (including critical care time)  Medications Ordered in ED Medications  acetaminophen (TYLENOL) tablet 1,000 mg (1,000 mg Oral Given 10/27/18 1351)  hydrOXYzine (ATARAX/VISTARIL) tablet 25 mg (25 mg Oral Given 10/27/18 1352)     Initial Impression / Assessment and Plan / ED Course  I have reviewed the triage vital signs and the nursing notes.  Pertinent labs & imaging results that were available during my care of the patient were reviewed by me and considered in my medical decision making (see chart for details).         This is a well-appearing [age] year old male with past medical history of bipolar disorder and schizophrenia presenting from jail in custody of police for hitting his head against a wall.  Reported self-harm attempt.  Reported witnessed LOC.  Reported retrograde amnesia by pt. Will obtain CT head, neck, radiographs of thoracic and lumbar spine.  Patient is neurologically intact here.  CT head and cervical spine, reviewed by me without acute traumatic abnormalities.  Reviewed radiographs of thoracic and lumbar spine without evidence of acute bony abnormality.  Patient is in no distress, continues to be neurologically intact.  He will speak with TTS regarding suicidality.  2:58 PM Patient medically cleared to speak with TTS.   Recommendations per TTS were continued  monitoring assessment by jail psychiatry.  Reviewed case with Hillery Jacksanika Lewis, nurse practitioner for psychiatry who recommends deferral of prescribing patient's psychiatric medication at this time.  Agree with this plan.  No further recommendations here in the emergency department.  He will continue to be on suicide watch while incarcerated.  Return precautions given for any severe headaches, visual disturbance, weakness or numbness in extremities, confusion.  Final Clinical Impressions(s) / ED Diagnoses   Final diagnoses:  Injury of head, initial encounter  Encounter for psychiatric assessment    ED Discharge Orders         Ordered    acetaminophen (TYLENOL) 325 MG tablet  Every 6 hours PRN     10/27/18 1608           Aviva KluverMurray, Alexiz Sustaita B, PA-C 10/27/18  1610    Arby Barrette, MD 10/28/18 1151

## 2018-10-27 NOTE — ED Notes (Signed)
Discharge instructions given to the officers who have patient in custody.  Will be given to the nurse

## 2018-11-02 ENCOUNTER — Other Ambulatory Visit: Payer: Self-pay

## 2018-11-02 ENCOUNTER — Emergency Department (HOSPITAL_COMMUNITY)
Admission: EM | Admit: 2018-11-02 | Discharge: 2018-11-02 | Attending: Emergency Medicine | Admitting: Emergency Medicine

## 2018-11-02 ENCOUNTER — Emergency Department (HOSPITAL_COMMUNITY)

## 2018-11-02 DIAGNOSIS — Z7289 Other problems related to lifestyle: Secondary | ICD-10-CM

## 2018-11-02 DIAGNOSIS — X79XXXA Intentional self-harm by blunt object, initial encounter: Secondary | ICD-10-CM | POA: Insufficient documentation

## 2018-11-02 DIAGNOSIS — Y9389 Activity, other specified: Secondary | ICD-10-CM | POA: Diagnosis not present

## 2018-11-02 DIAGNOSIS — S0081XA Abrasion of other part of head, initial encounter: Secondary | ICD-10-CM | POA: Insufficient documentation

## 2018-11-02 DIAGNOSIS — S0990XA Unspecified injury of head, initial encounter: Secondary | ICD-10-CM

## 2018-11-02 DIAGNOSIS — Y999 Unspecified external cause status: Secondary | ICD-10-CM | POA: Insufficient documentation

## 2018-11-02 DIAGNOSIS — Y92143 Cell of prison as the place of occurrence of the external cause: Secondary | ICD-10-CM | POA: Diagnosis not present

## 2018-11-02 DIAGNOSIS — IMO0002 Reserved for concepts with insufficient information to code with codable children: Secondary | ICD-10-CM

## 2018-11-02 NOTE — ED Triage Notes (Signed)
Pt here again from jail after hitting head on purpose on wall , pt same place as before , pt requesting mental health pt arrives via ems

## 2018-11-02 NOTE — ED Notes (Signed)
Pt given cup of coca cola and graham crackers. Okayed by Bunnie Pion.

## 2018-11-02 NOTE — ED Provider Notes (Signed)
[facility] EMERGENCY DEPARTMENT Provider Note   CSN: [id number] Arrival date & time: 11/02/18  1104     History   Chief Complaint Chief Complaint  Patient presents with   Head Injury    HPI Paul Collins is a 28 y.o. male who arrives from jail after hitting his head against a cinderblock wall.  Patient is currently incarcerated in the [facility] jail.  The patient bashed his head into the wall on purpose.  He states that he feels a little bit sleepy and cold.  He states that he lost consciousness.  The officers at bedside are unable to verify this.  He complains of a headache.  He has a small abrasion to the front of his forehead.  He states that he is up-to-date on his tetanus vaccination.  He denies any unilateral weakness, difficulty with speech or swallowing, changes in vision.  The patient is also asking to speak to behavioral health counselor.  I asked the officers at bedside if there is mental health and psychiatric services available in jail which they verified.  I told the patient that I could not see a reason for him to interact with our behavioral health specialist here as we manage theatric emergencies and because he is currently incarcerated and under the care of the [facility] jail I could not remove him from incarceration to place him into a psychiatric hospital nor could I currently coordinate care for when he is discharged because I have no idea when he will be released from jail, or if he will be released from jail.  The patient expresses understanding.     HPI  Past Medical History:  Diagnosis Date   Bipolar 1 disorder ([place])    Seizures ([place])     There are no active problems to display for this patient.   No past surgical history on file.      Home Medications    Prior to Admission medications   Medication Sig Start Date End Date Taking? Authorizing Provider  acetaminophen (TYLENOL) 325 MG tablet Take 2 tablets (650 mg total)  by mouth every 6 (six) hours as needed. 10/27/18   Albesa Seen, PA-C    Family History No family history on file.  Social History Social History   Tobacco Use   Smoking status: Not on file  Substance Use Topics   Alcohol use: Not on file   Drug use: Not on file     Allergies   Patient has no known allergies.   Review of Systems Review of Systems Ten systems reviewed and are negative for acute change, except as noted in the HPI.   Physical Exam Updated Vital Signs BP (!) 157/102 (BP Location: Right Arm)    Pulse 75    Temp 98.8 F (37.1 C) (Oral)    Resp 18    Ht 5\' 9"  (1.753 m)    Wt 68 kg    SpO2 98%    BMI 22.15 kg/m   Physical Exam Vitals signs and nursing note reviewed.  Constitutional:      General: He is not in acute distress.    Appearance: He is well-developed. He is not diaphoretic.  HENT:     Head: Normocephalic.     Comments: , And small abrasion to the superior part of the forehead and frontal region of the scalp. Eyes:     General: No scleral icterus.    Extraocular Movements: Extraocular movements intact.  Conjunctiva/sclera: Conjunctivae normal.     Pupils: Pupils are equal, round, and reactive to light.     Comments: No nystagmus  Neck:     Musculoskeletal: Normal range of motion and neck supple.  Cardiovascular:     Rate and Rhythm: Normal rate and regular rhythm.     Heart sounds: Normal heart sounds.  Pulmonary:     Effort: Pulmonary effort is normal. No respiratory distress.     Breath sounds: Normal breath sounds.  Abdominal:     Palpations: Abdomen is soft.     Tenderness: There is no abdominal tenderness.  Skin:    General: Skin is warm and dry.  Neurological:     General: No focal deficit present.     Mental Status: He is alert and oriented to person, place, and time.     Comments: Neurologic exam is somewhat limited due to the fact that the patient is handcuffed to the gurney.  I am unable to have him raise his legs or  fully extend his arms he is also unable to ambulate however the remainder of the neurologic exam is normal.  He has clear speech that his goal oriented.  He is able to follow two-step commands.  No cranial nerve deficits.  Sensation is intact.  He has normal strength and equal bilateral grip strength as well as normal strength with flexion extension of the ankles.  He has normal rapid alternating movements with finger taps.  Unable to assess finger-to-nose heel-to-shin.  Psychiatric:        Behavior: Behavior normal.      ED Treatments / Results  Labs (all labs ordered are listed, but only abnormal results are displayed) Labs Reviewed - No data to display  EKG None  Radiology No results found.  Procedures Procedures (including critical care time)  Medications Ordered in ED Medications - No data to display   Initial Impression / Assessment and Plan / ED Course  I have reviewed the triage vital signs and the nursing notes.  Pertinent labs & imaging results that were available during my care of the patient were reviewed by me and considered in my medical decision making (see chart for details).       Patient here with self-inflicted head injury.  He was seen for the same previously.  He has no objective neurological findings.  I personally reviewed the patient's head and C-spine CT which are unchanged from previous and showed no intracranial pathology, skull fractures or other.  [facility]  He is in stable condition.  Final Clinical Impressions(s) / ED Diagnoses   Final diagnoses:  Self-inflicted injury  Minor head injury, initial encounter    ED Discharge Orders    None       Arthor CaptainHarris, Heddy Vidana, PA-C 11/02/18 1340    Sabas SousBero, Michael M, MD 11/05/18 [phone]

## 2020-02-09 IMAGING — CT CT CERVICAL SPINE WITHOUT CONTRAST
3 of 4 series · 12 of 33 positions shown, 14 images · non-contrast
Comparison: 10/26/2018, 10/27/2018

CLINICAL DATA: Trauma

EXAM:
CT HEAD WITHOUT CONTRAST
CT CERVICAL SPINE WITHOUT CONTRAST
TECHNIQUE: Multidetector CT imaging of the head and cervical spine was
performed following the standard protocol without intravenous
contrast. Multiplanar CT image reconstructions of the cervical spine
were also generated.

[Series 6: c_spine 2.0 sag bone · sagittal · 0.35mm/px · 5 of 61 slices shown, 6 images]
[im 21/61  bone]
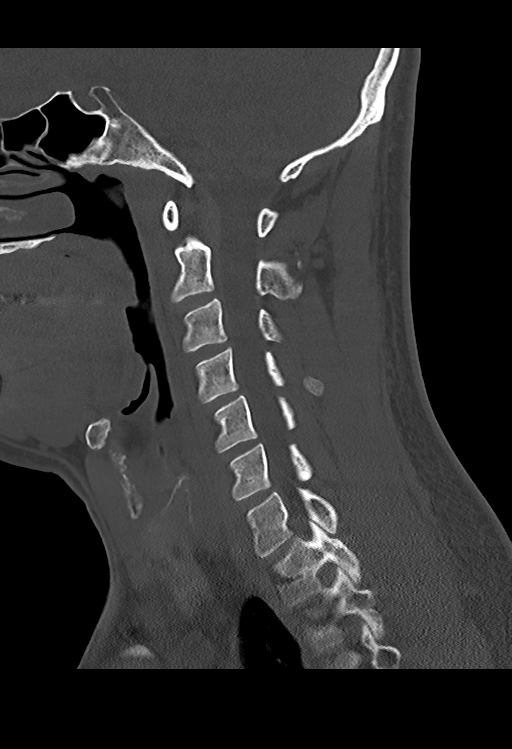
[im 26/61  bone]
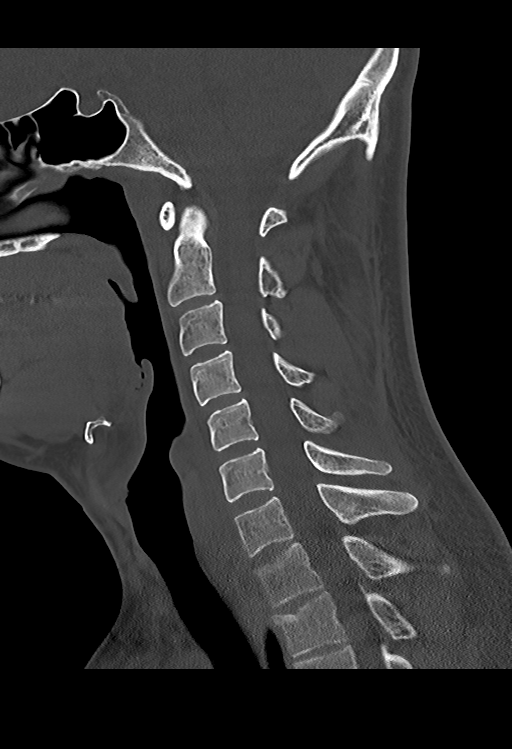
[im 31/61  soft-tissue]
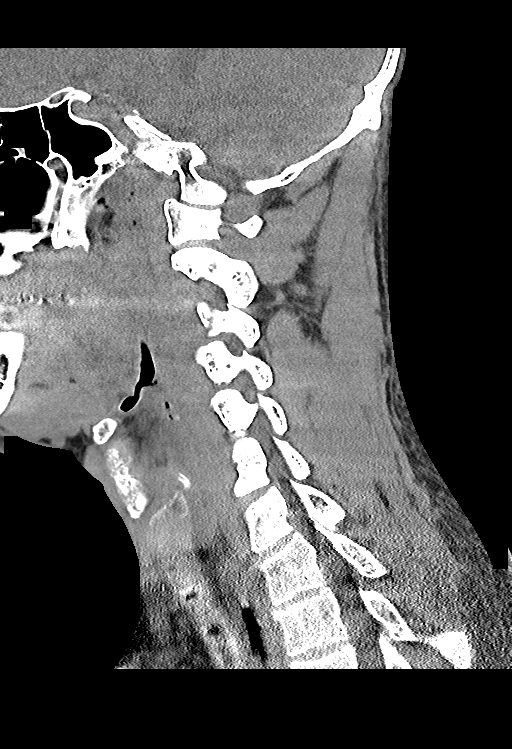
[im 31/61  bone]
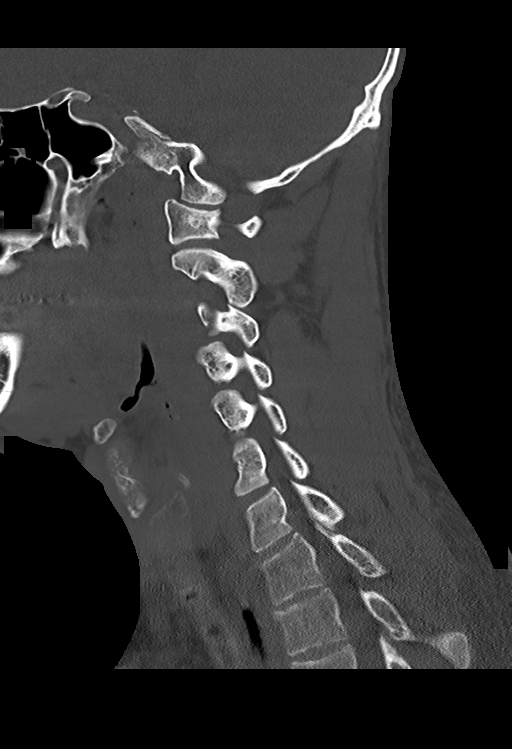
[im 36/61  bone]
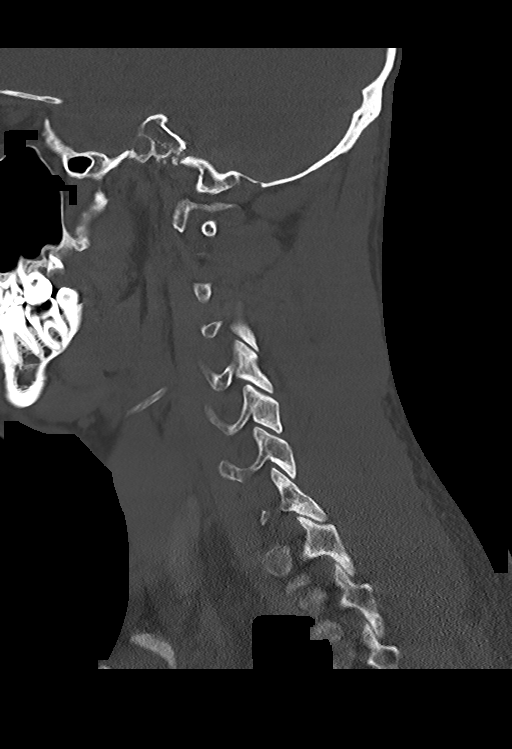
[im 41/61  bone]
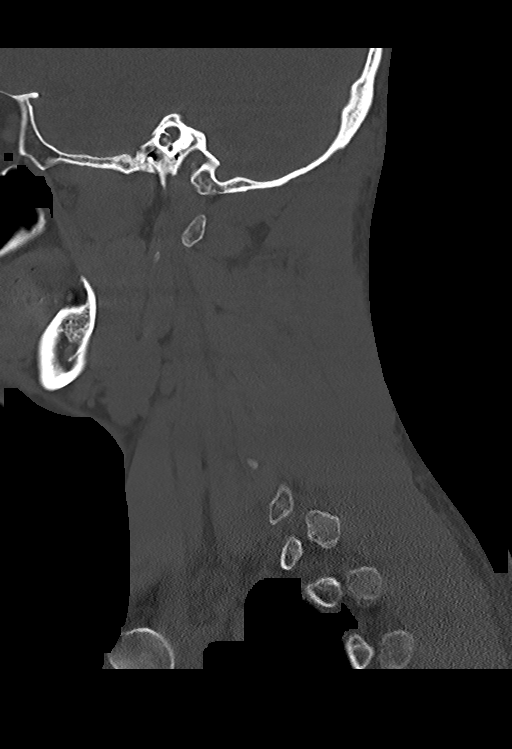

[Series 7: c_spine 2.0 cor bone · coronal · 0.35mm/px · 3 of 85 slices shown]
[im 17/85  bone]
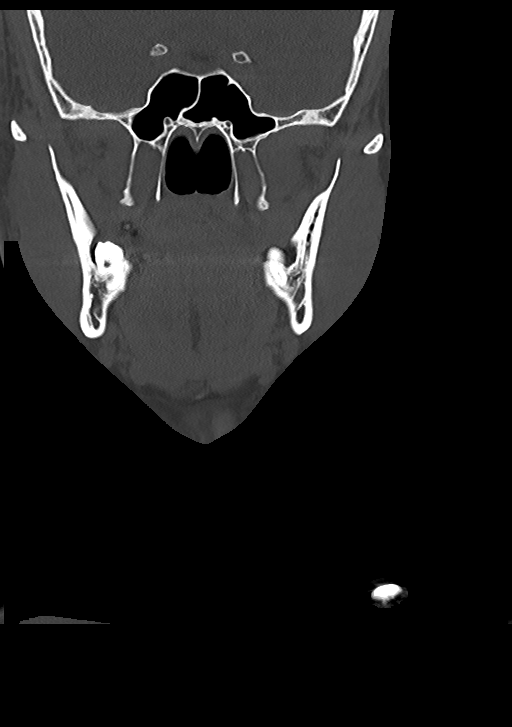
[im 34/85  bone]
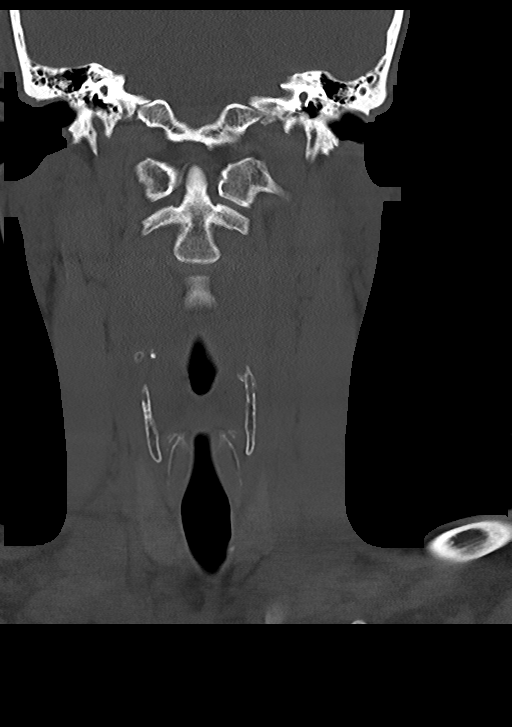
[im 51/85  bone]
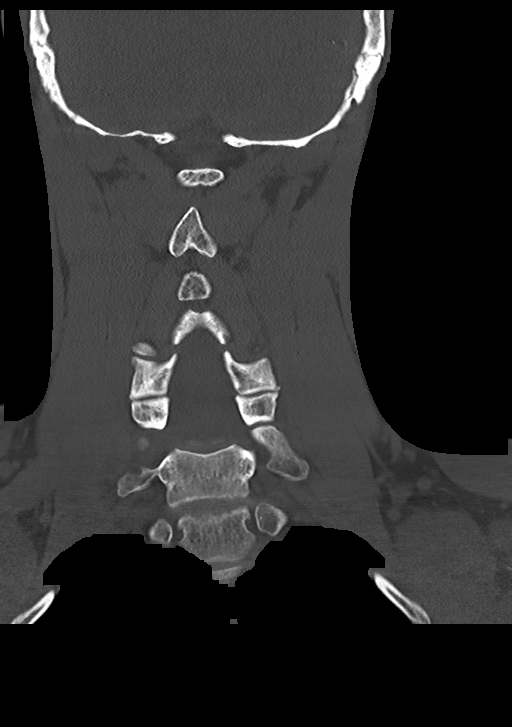

[Series 9: c_spine 1.0 st thins · axial · 0.37mm/px · z∈[+881,+1011]mm · 4 of 309 slices shown, 5 images]
[im 62/309  soft-tissue]
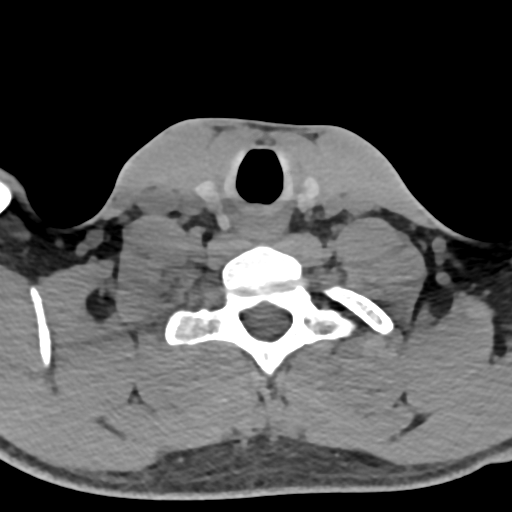
[im 62/309  bone]
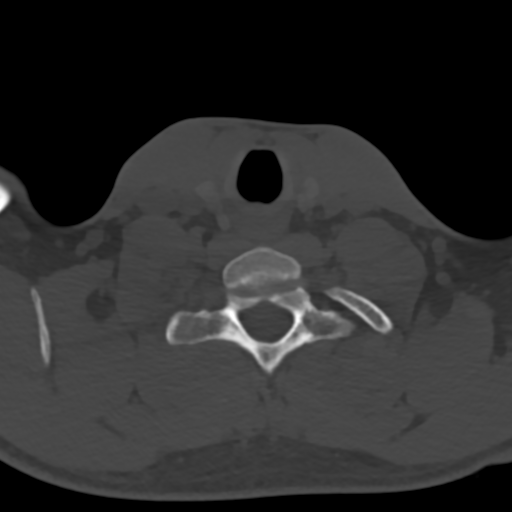
[im 124/309  bone]
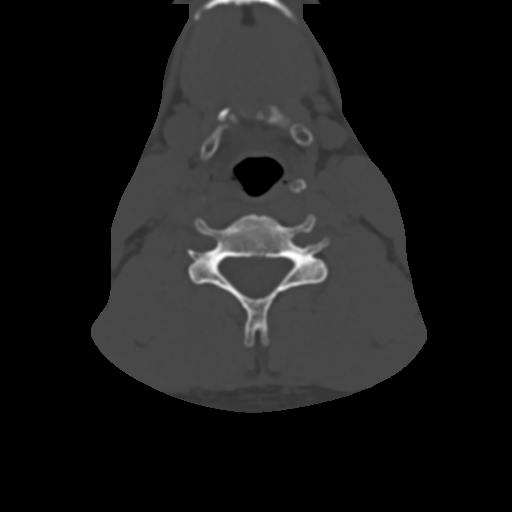
[im 185/309  bone]
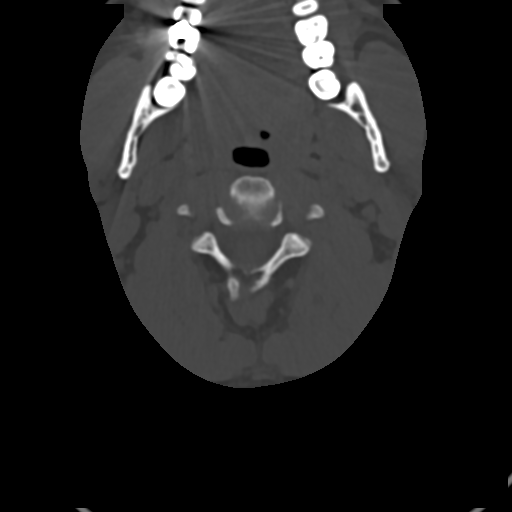
[im 247/309  bone]
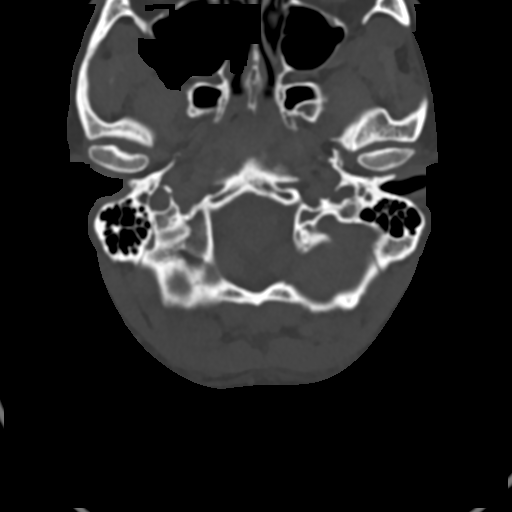

[12 of 33 positions shown; findings below may reference images not displayed]

FINDINGS: CT HEAD FINDINGS

Brain: No evidence of acute infarction, hemorrhage, hydrocephalus,
extra-axial collection or mass lesion/mass effect.

Vascular: No hyperdense vessel or unexpected calcification.

Skull: Normal. Negative for fracture or focal lesion.

Sinuses/Orbits: No acute finding.

Other: None.

CT CERVICAL SPINE FINDINGS

Alignment: Normal.

Skull base and vertebrae: There is a subtle, anterior superior
endplate fracture of T2 best appreciated on sagittal images (series
6, image 27). No primary bone lesion or focal pathologic process.

Soft tissues and spinal canal: No prevertebral fluid or swelling. No
visible canal hematoma.

Disc levels:  Intact.

Upper chest: Negative.

Other: None.
IMPRESSION: 1.  No acute intracranial pathology.

2. There is a subtle, anterior superior endplate fracture of T2 best
appreciated on sagittal images (series 6, image 27). This is
unchanged from prior examination dated 10/27/2018. No other fracture
or static subluxation of the cervical spine. Disc spaces are
preserved.
